# Patient Record
Sex: Male | Born: 2000 | Race: White | Hispanic: No | Marital: Single | Smoking: Never smoker
Health system: Southern US, Community
[De-identification: ages and names within clinical notes are randomized; demographics above are authoritative.]

---
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
## 2024-01-03 ENCOUNTER — Other Ambulatory Visit: Payer: Self-pay

## 2024-01-03 ENCOUNTER — Emergency Department
Admission: EM | Admit: 2024-01-03 | Discharge: 2024-01-03 | Disposition: A | Discharge status: Home / Self Care | Attending: Emergency Medicine | Admitting: Emergency Medicine

## 2024-01-03 DIAGNOSIS — M79604 Pain in right leg: Secondary | ICD-10-CM | POA: Diagnosis present

## 2024-01-03 DIAGNOSIS — M5431 Sciatica, right side: Secondary | ICD-10-CM | POA: Diagnosis not present

## 2024-01-03 MED ORDER — HYDROCODONE-ACETAMINOPHEN 5-325 MG PO TABS: 1.0000 | ORAL_TABLET | Freq: Three times a day (TID) | ORAL | 0 refills | Status: AC | PRN

## 2024-01-03 MED ORDER — NAPROXEN 500 MG PO TABS: 500.0000 mg | ORAL_TABLET | Freq: Two times a day (BID) | ORAL | 0 refills | Status: AC

## 2024-01-03 MED ORDER — PREDNISONE 20 MG PO TABS: 40.0000 mg | ORAL_TABLET | Freq: Every day | ORAL | 0 refills | Status: AC

## 2024-01-03 MED ORDER — CYCLOBENZAPRINE HCL 5 MG PO TABS: 5.0000 mg | ORAL_TABLET | Freq: Three times a day (TID) | ORAL | 0 refills | Status: AC | PRN

## 2024-01-03 MED ORDER — PREDNISONE 20 MG PO TABS
60.0000 mg | ORAL_TABLET | Freq: Once | ORAL | Status: AC
  Administered 2024-01-03: 60 mg via ORAL
  Filled 2024-01-03: qty 3

## 2024-01-03 MED ORDER — CYCLOBENZAPRINE HCL 10 MG PO TABS
10.0000 mg | ORAL_TABLET | Freq: Once | ORAL | Status: AC
  Administered 2024-01-03: 10 mg via ORAL
  Filled 2024-01-03: qty 1

## 2024-01-03 NOTE — ED Triage Notes (Signed)
 Pt states pain to R leg, pt denies injury. Pt thinks he "pinched a nerve".

## 2024-01-03 NOTE — ED Provider Notes (Signed)
 [facility], [place] Emergency Department Provider Note     Event Date/Time   First MD Initiated Contact with Patient 01/03/24 1645     (approximate)   History   Leg Pain   HPI  Krist Rosenboom. is a 23 y.o. male with a noncontributory medical history, presents to the ED endorsing nontraumatic right leg pain.  Patient with described achy pain from the posterior lumbar sacral junction on the right, down his entire posterior chain to the right ankle.  Patient denies any difficulty walking, catch, click, lock, giveaway.  No reports of any joint effusions, fevers, chills, or sweats.  He believes he has a pinched nerve.  He denies any bladder or bowel incontinence, foot drop, or saddle anesthesia.    Physical Exam   Triage Vital Signs: ED Triage Vitals [01/03/24 1634]  Encounter Vitals Group     BP (!) 142/80     Systolic BP Percentile      Diastolic BP Percentile      Pulse Rate (!) 108     Resp 18     Temp 98.6 F (37 C)     Temp src      SpO2 100 %     Weight 215 lb (97.5 kg)     Height 5\' 6"  (1.676 m)     Head Circumference      Peak Flow      Pain Score 8     Pain Loc      Pain Education      Exclude from Growth Chart     Most recent vital signs: Vitals:   01/03/24 1634  BP: (!) 142/80  Pulse: (!) 108  Resp: 18  Temp: 98.6 F (37 C)  SpO2: 100%    General Awake, no distress. NAD HEENT NCAT. PERRL. EOMI. No rhinorrhea. Mucous membranes are moist.  CV:  Good peripheral perfusion.  RESP:  Normal effort.  ABD:  No distention.  No CVA tenderness elicited MSK:  Normal spinal alignment without midline tenderness, spasm, deformity, or step-off.  Patient tenderness to palpation over the right SI joint region.  Normal gait on exam.  Normal lumbar flexion extension range.  Normal hip flexion on exam. NEURO: Cranial nerves II to XII grossly intact.  Normal LE DTRs bilaterally.  Normal toe dorsiflexion foot eversion.  Negative seated straight leg  raise bilaterally.    ED Results / Procedures / Treatments   Labs (all labs ordered are listed, but only abnormal results are displayed) Labs Reviewed - No data to display   EKG    RADIOLOGY  No results found.   PROCEDURES:  Critical Care performed: No  Procedures   MEDICATIONS ORDERED IN ED: Medications  predniSONE  (DELTASONE ) tablet 60 mg (60 mg Oral Given 01/03/24 1753)  cyclobenzaprine  (FLEXERIL ) tablet 10 mg (10 mg Oral Given 01/03/24 1753)     IMPRESSION / MDM / ASSESSMENT AND PLAN / ED COURSE  I reviewed the triage vital signs and the nursing notes.                              Differential diagnosis includes, but is not limited to, lumbar strain, myalgias, lumbago, sciatica, lumbar radiculopathy  Patient's presentation is most consistent with acute, uncomplicated illness.  Patient's diagnosis is consistent with right sided sciatica.  Patient presents with acute right-sided posterior chain leg pain without preceding injury or trauma.  No bladder or bowel incontinence  or red flags on exam.  Reassuring workup at this time.  No indication for imaging although we did discuss the option of baseline x-rays of the lumbar sacral spine.  Patient is opted for acute management.  Patient will be discharged home with prescriptions for cyclobenzaprine , hydrocodone , naproxen , and prednisone . Patient is to follow up with Ortho or PCP as discussed, as needed or otherwise directed. Patient is given ED precautions to return to the ED for any worsening or new symptoms.   FINAL CLINICAL IMPRESSION(S) / ED DIAGNOSES   Final diagnoses:  Sciatica of right side     Rx / DC Orders   ED Discharge Orders          Ordered    cyclobenzaprine  (FLEXERIL ) 5 MG tablet  3 times daily PRN        01/03/24 1743    predniSONE  (DELTASONE ) 20 MG tablet  Daily with breakfast        01/03/24 1743    HYDROcodone -acetaminophen  (NORCO/VICODIN) 5-325 MG tablet  3 times daily PRN        01/03/24  1743    naproxen  (NAPROSYN ) 500 MG tablet  2 times daily with meals        01/03/24 1749             Note:  This document was prepared using Dragon voice recognition software and may include unintentional dictation errors.    May Sparks, PA-C 01/05/24 1024    Arline Bennett, MD 01/10/24 [phone]

## 2024-01-03 NOTE — Discharge Instructions (Addendum)
 Your exam is consistent with sciatic nerve irritation.  Take the prescription meds as directed.  Follow-up with your primary provider or local urgent care as discussed.  Return to the ED as needed.

## 2024-07-04 ENCOUNTER — Ambulatory Visit: Payer: Self-pay

## 2024-07-04 NOTE — Telephone Encounter (Signed)
 FYI Only or Action Required?: FYI only for provider: appointment scheduled on 07/05/24.  Patient was last seen in primary care on unknown.  Called Nurse Triage reporting Leg Pain.  Symptoms began several months ago.  Interventions attempted: Prescription medications: prednisone  and Flexeril , Rest, hydration, or home remedies, and Ice/heat application.  Symptoms are: gradually worsening.  Triage Disposition: See Physician Within 24 Hours  Patient/caregiver understands and will follow disposition?: Yes                            Copied from CRM #[id number]. Topic: Clinical - Red Word Triage >> Jul 04, 2024  4:26 PM Wess RAMAN wrote: Red Word that prompted transfer to Nurse Triage: Patient right leg is in pain and also goes numb. Pain level 8.  Reason for Disposition  Numbness in a leg or foot (i.e., loss of sensation)  Answer Assessment - Initial Assessment Questions 1. ONSET: When did the pain start?      3 months ago, went away for a month, returned last Friday 2. LOCATION: Where is the pain located?      Right leg from buttocks to toes 3. PAIN: How bad is the pain?    (Scale 1-10; or mild, moderate, severe)     Rates pain an 8, intermittent sharp pain 4. WORK OR EXERCISE: Has there been any recent work or exercise that involved this part of the body?      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 CAUSE: What do you think is causing the leg pain?     Sciatic nerve 6. OTHER SYMPTOMS: Do you have any other symptoms? (e.g., chest pain, back pain, breathing difficulty, swelling, rash, fever, numbness, weakness)     Intermittent numbness/tingling, denies sudden changes in vision and speech, denies dizziness, denies facial drooping, denies difficulty breathing, denies chest pain, denies discoloration, denies swelling 7. PREGNANCY: Is there any chance you are pregnant? When was your last menstrual period?     N/A  Protocols used: Leg Pain-A-AH

## 2024-07-05 ENCOUNTER — Other Ambulatory Visit: Payer: Self-pay

## 2024-07-05 ENCOUNTER — Emergency Department
Admission: EM | Admit: 2024-07-05 | Discharge: 2024-07-05 | Disposition: A | Discharge status: Home / Self Care | Source: Ambulatory Visit | Attending: Emergency Medicine | Admitting: Emergency Medicine

## 2024-07-05 ENCOUNTER — Ambulatory Visit (INDEPENDENT_AMBULATORY_CARE_PROVIDER_SITE_OTHER): Admitting: Family Medicine

## 2024-07-05 ENCOUNTER — Encounter: Payer: Self-pay | Admitting: Family Medicine

## 2024-07-05 ENCOUNTER — Emergency Department

## 2024-07-05 VITALS — BP 131/89 | HR 105 | Temp 98.1°F | Resp 20 | Ht 66.0 in | Wt 213.0 lb

## 2024-07-05 DIAGNOSIS — M5416 Radiculopathy, lumbar region: Secondary | ICD-10-CM | POA: Diagnosis not present

## 2024-07-05 DIAGNOSIS — R200 Anesthesia of skin: Secondary | ICD-10-CM | POA: Diagnosis not present

## 2024-07-05 DIAGNOSIS — M545 Low back pain, unspecified: Secondary | ICD-10-CM | POA: Insufficient documentation

## 2024-07-05 DIAGNOSIS — M79604 Pain in right leg: Secondary | ICD-10-CM | POA: Diagnosis present

## 2024-07-05 MED ORDER — GABAPENTIN 100 MG PO CAPS: 100.0000 mg | ORAL_CAPSULE | Freq: Three times a day (TID) | ORAL | 3 refills | Status: AC

## 2024-07-05 NOTE — Progress Notes (Signed)
 New Patient Office Visit  Subjective    Patient ID: Michael Sebesta., male    DOB: 08-Jul-2001  Age: 23 y.o. MRN: [id number]  CC:  Chief Complaint  Patient presents with   Establish Care    HPI Michael Carmean. presents to establish care  Discussed the use of AI scribe software for clinical note transcription with the patient, who gave verbal consent to proceed.  History of Present Illness   Michael Dombek. is a 23 year old male who presents with chronic right leg pain radiating from the lower back to the toes.  He has been experiencing sharp, radiating pain for approximately four months, with no known injury to trigger its onset. The pain radiates from above the buttock down the entire leg to the toes, primarily affecting the front of the shin and the back of the upper leg. It is constant and significantly impacts his sleep, allowing only five to six hours of restless sleep per night.  He went to UC and was prescribed prednisone , and a muscle relaxer.  Initially this helped.  Then his back started hurting badly again.  He denies any trauma but routinely lifts 40 to 80 pounds.  He went back to UC an dgot the prednisone , muscle relaxers and IBU but now it is not helping.  The muscle relaxers, which helped previously, are no longer providing relief.  He has not undergone any physical therapy or imaging studies such as x-rays or MRIs for this issue. He works in quarry manager, which involves heavy lifting, and he suspects that his work may have contributed to the problem. He recalls a minor back injury two years ago that resolved quickly.  No other medical problems, surgeries, or use of medications other than those mentioned. He vapes but does not consume alcohol or use drugs.      Outpatient Encounter Medications as of 07/05/2024  Medication Sig   gabapentin (NEURONTIN) 100 MG capsule Take 1 capsule (100 mg total) by mouth 3 (three) times daily.   ibuprofen (ADVIL) 800 MG tablet Take 800 mg  by mouth 3 (three) times daily.   predniSONE  (DELTASONE ) 20 MG tablet Take 20 mg by mouth daily with breakfast.   cyclobenzaprine  (FLEXERIL ) 5 MG tablet Take 1 tablet (5 mg total) by mouth 3 (three) times daily as needed.   tiZANidine (ZANAFLEX) 4 MG capsule Take 1 capsule by mouth 3 (three) times daily. (Patient not taking: Reported on 07/05/2024)   No facility-administered encounter medications on file as of 07/05/2024.    History reviewed. No pertinent past medical history.  History reviewed. No pertinent surgical history.  History reviewed. No pertinent family history.  Social History   Socioeconomic History   Marital status: Single    Spouse name: Not on file   Number of children: Not on file   Years of education: Not on file   Highest education level: Not on file  Occupational History   Not on file  Tobacco Use   Smoking status: Never   Smokeless tobacco: Never  Substance and Sexual Activity   Alcohol use: No   Drug use: No   Sexual activity: Not on file  Other Topics Concern   Not on file  Social History Narrative   Not on file   Social Drivers of Health   Financial Resource Strain: Low Risk  (02/26/2024)   Received from [facility]   Overall Financial Resource Strain (CARDIA)    Difficulty of Paying Living Expenses:  Not hard at all  Food Insecurity: No Food Insecurity (02/26/2024)   Received from [facility]   Hunger Vital Sign    Within the past 12 months, you worried that your food would run out before you got the money to buy more.: Never true    Within the past 12 months, the food you bought just didn't last and you didn't have money to get more.: Never true  Transportation Needs: No Transportation Needs (02/26/2024)   Received from [facility] - Transportation    In the past 12 months, has lack of transportation kept you from medical appointments or from getting medications?: No    Lack of  Transportation (Non-Medical): No  Physical Activity: Not on file  Stress: Not on file  Social Connections: Not on file  Intimate Partner Violence: Not on file         Objective   BP 131/89 (BP Location: Left Arm, Patient Position: Standing, Cuff Size: Normal)   Pulse (!) 105   Temp 98.1 F (36.7 C) (Oral)   Resp 20   Ht 5' 6 (1.676 m)   Wt 213 lb (96.6 kg)   SpO2 98%   BMI 34.38 kg/m    Physical Exam Vitals and nursing note reviewed.  Constitutional:      Appearance: Normal appearance.  HENT:     Head: Normocephalic and atraumatic.  Eyes:     Conjunctiva/sclera: Conjunctivae normal.  Cardiovascular:     Rate and Rhythm: Normal rate and regular rhythm.  Pulmonary:     Effort: Pulmonary effort is normal.     Breath sounds: Normal breath sounds.  Musculoskeletal:     Right lower leg: No edema.     Left lower leg: No edema.  Skin:    General: Skin is warm and dry.     Comments: DTRs are 2+ and equal at the patella and the Achilles bilaterally.  Leg lift is positive at 5 degrees supine right left and no pain with leg lift contralateral.  Neurological:     General: No focal deficit present.     Mental Status: He is alert and oriented to person, place, and time.  Psychiatric:        Mood and Affect: Mood normal.        Behavior: Behavior normal.        Thought Content: Thought content normal.        Judgment: Judgment normal.            The ASCVD Risk score (Arnett DK, et al., 2019) failed to calculate for the following reasons:   The 2019 ASCVD risk score is only valid for ages 2 to 52     Assessment & Plan:  Lumbar radiculopathy -     Ambulatory referral to Physical Therapy -     DG Lumbar Spine Complete; Future  Lumbar radiculopathy, right Assessment & Plan: Had an initial injury that caused right sided lumbar radiculopathy.  Before this could heal he apparently reinjured it.  It is not responding to prednisone , muscle relaxers and ibuprofen.   Check a lumbar spine film to look for lytic or blastic lesions.  Trial of gabapentin 100 mg 3 times daily.  Will advance this medication as he adjusts to the side effects.  Referral to physical therapy.  Follow-up in a month.  If no significant improvement then will get MRI of the back   Other orders -  Gabapentin; Take 1 capsule (100 mg total) by mouth 3 (three) times daily.  Dispense: 90 capsule; Refill: 3    Return in about 4 weeks (around 08/02/2024).   Thermon Zulauf K Genesi Stefanko, MD

## 2024-07-05 NOTE — ED Triage Notes (Signed)
 Pt seen at PCP this afternoon following work related injury where he was carrying shingles and his leg gave out and he fell onto his right side. PCP prescribed medications for sciatica which contributed to fall and referred him to ER for XR.

## 2024-07-05 NOTE — ED Provider Notes (Signed)
 [facility] Provider Note    Event Date/Time   First MD Initiated Contact with Patient 07/05/24 1701     (approximate)   History   Leg Injury   HPI  Michael Peterson. is a 23 y.o. male  with a past medical history of right lumbar radiculopathy Presents to the emergency department with acute on chronic sharp right leg pain that radiates from his back.  Patient states that has been there for over 4 months.  He occasionally gets some numbness going down his back right thigh into his anterior right shin and stops at his ankle. 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 Reports he lifts with his legs and not his back.  Denies nausea, vomiting, diarrhea, abdominal pain, chest pain, shortness of breath, dysuria, saddle anesthesia, urinary retention, history of cancer, fever, IV drug use, bowel incontinence, recent weight loss, weakness in his legs.  Reports he came to the ER for imaging per his primary care provider Dr. Ziglar's request for x-ray of lumbar spine and is in the process of starting physical therapy. Denies any recent trauma or specific injury. No prior history of back or leg surgeries.  He was recently seen at urgent care on Friday Nov 14 and started on prednisone , muscle relaxers and ibuprofen which is not helping.  He was also given a shot of Toradol there which did not help.  Per Dr. Zigler's note, he was recently started on gabapentin  today and she discussed with him the side effects and how it may take a couple days to get into his system to see if it would help his pain.   Physical Exam   Triage Vital Signs: ED Triage Vitals  Encounter Vitals Group     BP 07/05/24 1538 (!) 137/91     Girls Systolic BP Percentile --      Girls Diastolic BP Percentile --      Boys Systolic BP Percentile --      Boys Diastolic BP Percentile --      Pulse Rate 07/05/24 1538 99     Resp 07/05/24 1538 17     Temp 07/05/24 1538 98.4 F (36.9 C)      Temp Source 07/05/24 1538 Oral     SpO2 07/05/24 1538 98 %     Weight 07/05/24 1537 212 lb (96.2 kg)     Height 07/05/24 1537 5' 6 (1.676 m)     Head Circumference --      Peak Flow --      Pain Score 07/05/24 1537 7     Pain Loc --      Pain Education --      Exclude from Growth Chart --     Most recent vital signs: Vitals:   07/05/24 1700 07/05/24 1756  BP:  (!) 130/90  Pulse:  90  Resp:  18  Temp:    SpO2: 100% 100%   General: Well-appearing, in no acute distress. Appears stated age. Head: Normocephalic, atraumatic. CV: Regular rate, 90 bpm. Dorsalis pedis pulses 2+ bilaterally. <2 second capillary refill. Respiratory: Breath sounds clear b/l. No wheezes, rales, or rhonchi. No respiratory distress. Normal respiratory effort. Skin:Warm, dry, intact. No rashes, lesions, or ecchymosis. No cyanosis or pallor. Neurological: A&Ox4 to person, place, time, and situation. Sensation intact and equal to L4, L5, and S1.  GI: Soft, non-distended, non-tender. No rebound or guarding.  MSK: No lumbar spinal tenderness. TTP along  right paraspinal region, worse with lumbar flexion. Normal ROM with knee extension, dorsiflexion, and plantarflexion and 5/5 strength in bilateral lower extremities. No swelling or obvious deformities.  Straight leg raise negative b/l. No CVA tenderness bilaterally.  Able to perform all back motions with lumbar flexion reproducing his pain.    ED Results / Procedures / Treatments   Labs (all labs ordered are listed, but only abnormal results are displayed) Labs Reviewed - No data to display   EKG     RADIOLOGY X ray lumbar spine  FINDINGS: Vertebral body alignment and heights are normal. Wall facet arthropathy over the lower lumbar spine. Minimal disc space narrowing at the L4-5 level. No acute compression fracture or spondylolisthesis/spondylolysis. Remainder of the exam is unremarkable.   IMPRESSION: 1. No acute findings. 2. 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   PROCEDURES:  Critical Care performed: No   Procedures   MEDICATIONS ORDERED IN ED: Medications - No data to display   IMPRESSION / MDM / ASSESSMENT AND PLAN / ED COURSE  I reviewed the triage vital signs and the nursing notes.                              Differential diagnosis includes, but is not limited to, lumbar radiculopathy, spondylosis, musculoskeletal strain,  Patient's presentation is most consistent with exacerbation of chronic illness.  Patient here with acute on chronic back pain radiating down into his right leg.  Does not have any red flag symptoms of back pain at this time.  He is well-appearing on exam, afebrile. Able to move and stand on his legs with normal gait pattern with good strength. BP slightly elevated 130/90, but he is on prednisone  at this time.  Per Dr. Darilyn note, she wanted a lumbar spine x-ray to check for lytic or blastic lesions.  X-ray of lumbar spine was ordered in triage.  X-ray shows mild spondylosis with minimal disc disease at L4-5. I independently viewed the x-ray and radiologist's report.  I agree with the radiologist's report that there are no acute findings.  Told patient he should continue with his current medication regimen and see if the gabapentin  will help.  He does not want any medication for pain at this time although I offered.  He will follow-up on his referral to physical therapy and follow-up in a month with Dr. Ziglar.  If he is not having any improvement after physical therapy trial, has plan for MRI of his back.   The patient may return to the emergency department for any new, worsening, or concerning symptoms. Patient was given the opportunity to ask questions; all questions were answered. Emergency department return precautions were discussed with the patient.  Patient is in agreement to the treatment plan.  Patient is stable for discharge.   FINAL CLINICAL  IMPRESSION(S) / ED DIAGNOSES   Final diagnoses:  Low back pain radiating to right lower extremity     Rx / DC Orders   ED Discharge Orders     None        Note:  This document was prepared using Dragon voice recognition software and may include unintentional dictation errors.     Sheron Salm, PA-C 07/06/24 1524    Jossie Artist POUR, MD 07/09/24 [phone]

## 2024-07-05 NOTE — Assessment & Plan Note (Signed)
 Had an initial injury that caused right sided lumbar radiculopathy.  Before this could heal he apparently reinjured it.  It is not responding to prednisone , muscle relaxers and ibuprofen.  Check a lumbar spine film to look for lytic or blastic lesions.  Trial of gabapentin 100 mg 3 times daily.  Will advance this medication as he adjusts to the side effects.  Referral to physical therapy.  Follow-up in a month.  If no significant improvement then will get MRI of the back

## 2024-07-05 NOTE — Discharge Instructions (Signed)
 You were evaluated in the Emergency Department today for back pain. Your evaluation suggests no acute abnormalities which require further intervention at this time.  Please follow-up with your primary care provider for further imaging if needed.  Please continue taking the medication that they suggested.  - Move around as tolerated but avoiding heavy lifting. "Bed rest" is not recommended nor is it the best treatment for low back pain.   -- Do not drink alcohol, drive a car, operate machinery, or get up on ladders or heights when taking any prescribed pain medications.  -- Do not drive home if you received prescribed pain medications here in the ED.  Please follow up with your primary care physician as needed or any other providers listed in this paperwork. If you do not have a primary doctor, you can call your insurance company to find one.  If you do not have insurance, you can go to the finance/registration department for more assistance.  Return to the ED immediately if you develop any of the following problems: -- Leaking urine or difficulty urinating; -- Inability to control your bowels; -- New numbness or weakness in your legs or numbness between your legs; -- Inability to walk -- Fever

## 2024-08-02 ENCOUNTER — Ambulatory Visit: Admitting: Family Medicine
